# Patient Record
Sex: Female | Born: 1980 | Race: White | Hispanic: No | Marital: Married | Smoking: Never smoker
Health system: Southern US, Community
[De-identification: ages and names within clinical notes are randomized; demographics above are authoritative.]

## PROBLEM LIST (undated history)

## (undated) DIAGNOSIS — Z8619 Personal history of other infectious and parasitic diseases: Secondary | ICD-10-CM

## (undated) DIAGNOSIS — E282 Polycystic ovarian syndrome: Secondary | ICD-10-CM

## (undated) DIAGNOSIS — E781 Pure hyperglyceridemia: Secondary | ICD-10-CM

## (undated) DIAGNOSIS — M549 Dorsalgia, unspecified: Secondary | ICD-10-CM

## (undated) HISTORY — PX: NO PAST SURGERIES: SHX2092

## (undated) HISTORY — DX: Dorsalgia, unspecified: M54.9

## (undated) HISTORY — DX: Personal history of other infectious and parasitic diseases: Z86.19

## (undated) HISTORY — PX: ABDOMINAL HYSTERECTOMY: SHX81

## (undated) HISTORY — DX: Pure hyperglyceridemia: E78.1

---

## 2006-03-03 HISTORY — PX: OVARIAN CYST REMOVAL: SHX89

## 2010-08-28 ENCOUNTER — Encounter: Payer: Self-pay | Admitting: Obstetrics and Gynecology

## 2011-01-06 ENCOUNTER — Emergency Department (INDEPENDENT_AMBULATORY_CARE_PROVIDER_SITE_OTHER)
Admission: EM | Admit: 2011-01-06 | Discharge: 2011-01-06 | Disposition: A | Discharge status: Home / Self Care | Payer: BC Managed Care – PPO | Source: Home / Self Care | Attending: Family Medicine | Admitting: Family Medicine

## 2011-01-06 DIAGNOSIS — J020 Streptococcal pharyngitis: Secondary | ICD-10-CM

## 2011-01-06 HISTORY — DX: Polycystic ovarian syndrome: E28.2

## 2011-01-06 MED ORDER — ACETAMINOPHEN 325 MG PO TABS
ORAL_TABLET | ORAL | Status: AC
  Administered 2011-01-06: 650 mg via ORAL
  Filled 2011-01-06: qty 2

## 2011-01-06 MED ORDER — AMOXICILLIN 500 MG PO CAPS: 500.0000 mg | ORAL_CAPSULE | Freq: Three times a day (TID) | ORAL | Status: AC

## 2011-01-06 NOTE — ED Notes (Signed)
Strep test results: positive for Strep

## 2011-01-06 NOTE — ED Provider Notes (Signed)
History     CSN: [id number] Arrival date & time: 01/06/2011  8:48 AM   First MD Initiated Contact with Patient 01/06/11 [phone]      Chief Complaint  Patient presents with  . Sore Throat    (Consider location/radiation/quality/duration/timing/severity/associated sxs/prior treatment) Patient is a 30 y.o. female presenting with pharyngitis. The history is provided by the patient.  Sore Throat This is a new problem. The current episode started more than 2 days ago. The problem occurs constantly. The problem has not changed since onset.Pertinent negatives include no chest pain, no headaches and no shortness of breath. The symptoms are aggravated by swallowing. The symptoms are relieved by nothing. She has tried nothing for the symptoms.    Past Medical History  Diagnosis Date  . Polycystic ovarian disease     History reviewed. No pertinent past surgical history.  No family history on file.  History  Substance Use Topics  . Smoking status: Never Smoker   . Smokeless tobacco: Not on file  . Alcohol Use: No    OB History    Grav Para Term Preterm Abortions TAB SAB Ect Mult Living                  Review of Systems  Constitutional: Positive for fever.  HENT: Positive for ear pain, sore throat and trouble swallowing. Negative for sneezing.   Eyes: Negative.   Respiratory: Negative for shortness of breath.   Cardiovascular: Negative.  Negative for chest pain.  Gastrointestinal: Negative.   Genitourinary: Negative.   Musculoskeletal: Negative.   Neurological: Negative for headaches.    Allergies  Review of patient's allergies indicates no known allergies.  Home Medications   Current Outpatient Rx  Name Route Sig Dispense Refill  . TYLENOL PO Oral Take by mouth.      Marland Kitchen ADVIL PO Oral Take by mouth.        BP 115/76  Pulse 90  Temp(Src) 99.9 F (37.7 C) (Oral)  Resp 20  SpO2 100%  Physical Exam  Constitutional: She appears well-developed and well-nourished.    HENT:  Head: Normocephalic and atraumatic.  Right Ear: Tympanic membrane normal.  Left Ear: Tympanic membrane is injected.  Nose: Nose normal.  Mouth/Throat: Uvula is midline and mucous membranes are normal. Posterior oropharyngeal erythema present. No oropharyngeal exudate.  Eyes: EOM are normal. Pupils are equal, round, and reactive to light.  Neck: Normal range of motion. Neck supple.  Cardiovascular: Normal rate and regular rhythm.   Pulmonary/Chest: Effort normal.  Lymphadenopathy:    She has cervical adenopathy.       Right cervical: Superficial cervical adenopathy present.       Left cervical: Superficial cervical adenopathy present.  Skin: Skin is warm and dry.    ED Course  Procedures (including critical care time)   Labs Reviewed  POCT RAPID STREP A   No results found.   No diagnosis found.    MDM  Your rapid strep test was positive. Combined with 3/4 Centor criteria, will treat for strep pharyngitis with antibiotic therapy.        Richardo Priest, MD 01/06/11 1012

## 2011-01-06 NOTE — ED Notes (Signed)
c/o sore throat, bilateral Ear pain and fever since 01-01-11. Denies cold sx.

## 2011-09-22 ENCOUNTER — Ambulatory Visit (INDEPENDENT_AMBULATORY_CARE_PROVIDER_SITE_OTHER): Payer: 59 | Admitting: Internal Medicine

## 2011-09-22 ENCOUNTER — Encounter: Payer: Self-pay | Admitting: Internal Medicine

## 2011-09-22 VITALS — BP 100/72 | HR 68 | Temp 98.5°F | Ht 64.0 in | Wt 167.0 lb

## 2011-09-22 DIAGNOSIS — Z0000 Encounter for general adult medical examination without abnormal findings: Secondary | ICD-10-CM

## 2011-09-22 DIAGNOSIS — E781 Pure hyperglyceridemia: Secondary | ICD-10-CM | POA: Insufficient documentation

## 2011-09-22 NOTE — Patient Instructions (Addendum)
It was good to see you today. I suspect your triglyceride (cholesterol) abnormalities on recent labs is related to being "non-fasting" - will repeat rather than prescribe medications at this time. Rest of your cholesterol looks very good Health Maintenance reviewed - all recommended immunizations and age-appropriate screenings are up-to-date. Test(s) ordered today. Your results will be called to you after review (48-72hours after test completion). If any changes need to be made, you will be notified at that time. Work on lifestyle changes as discussed (low fat, low carb, increased protein diet; improved exercise efforts; weight loss) to control sugar, blood pressure and cholesterol levels and/or reduce risk of developing other medical problems. Look into [email or web address] or other type of food journal to assist you in this process. Please schedule followup in 1-2 years for medical physical, call sooner if problems.  Hypertriglyceridemia   Diet for High blood levels of Triglycerides Most fats in food are triglycerides. Triglycerides in your blood are stored as fat in your body. High levels of triglycerides in your blood may put you at a greater risk for heart disease and stroke.   Normal triglyceride levels are less than 150 mg/dL. Borderline high levels are 150-199 mg/dl. High levels are 200 - 499 mg/dL, and very high triglyceride levels are greater than 500 mg/dL. The decision to treat high triglycerides is generally based on the level. For people with borderline or high triglyceride levels, treatment includes weight loss and exercise. Drugs are recommended for people with very high triglyceride levels. Many people who need treatment for high triglyceride levels have metabolic syndrome. This syndrome is a collection of disorders that often include: insulin resistance, high blood pressure, blood clotting problems, high cholesterol and triglycerides. TESTING PROCEDURE FOR TRIGLYCERIDES  You should not  eat 4 hours before getting your triglycerides measured. The normal range of triglycerides is between 10 and 250 milligrams per deciliter (mg/dl). Some people may have extreme levels (1000 or above), but your triglyceride level may be too high if it is above 150 mg/dl, depending on what other risk factors you have for heart disease.   People with high blood triglycerides may also have high blood cholesterol levels. If you have high blood cholesterol as well as high blood triglycerides, your risk for heart disease is probably greater than if you only had high triglycerides. High blood cholesterol is one of the main risk factors for heart disease.  CHANGING YOUR DIET   Your weight can affect your blood triglyceride level. If you are more than 20% above your ideal body weight, you may be able to lower your blood triglycerides by losing weight. Eating less and exercising regularly is the best way to combat this. Fat provides more calories than any other food. The best way to lose weight is to eat less fat. Only 30% of your total calories should come from fat. Less than 7% of your diet should come from saturated fat. A diet low in fat and saturated fat is the same as a diet to decrease blood cholesterol. By eating a diet lower in fat, you may lose weight, lower your blood cholesterol, and lower your blood triglyceride level.   Eating a diet low in fat, especially saturated fat, may also help you lower your blood triglyceride level. Ask your dietitian to help you figure how much fat you can eat based on the number of calories your caregiver has prescribed for you.   Exercise, in addition to helping with weight loss may also help lower triglyceride levels.  Alcohol can increase blood triglycerides. You may need to stop drinking alcoholic beverages.   Too much carbohydrate in your diet may also increase your blood triglycerides. Some complex carbohydrates are necessary in your diet. These may include bread, rice,  potatoes, other starchy vegetables and cereals.   Reduce "simple" carbohydrates. These may include pure sugars, candy, honey, and jelly without losing other nutrients. If you have the kind of high blood triglycerides that is affected by the amount of carbohydrates in your diet, you will need to eat less sugar and less high-sugar foods. Your caregiver can help you with this.   Adding 2-4 grams of fish oil (EPA+ DHA) may also help lower triglycerides. Speak with your caregiver before adding any supplements to your regimen.  Following the Diet  Maintain your ideal weight. Your caregivers can help you with a diet. Generally, eating less food and getting more exercise will help you lose weight. Joining a weight control group may also help. Ask your caregivers for a good weight control group in your area.   Eat low-fat foods instead of high-fat foods. This can help you lose weight too.   These foods are lower in fat. Eat MORE of these:   Dried beans, peas, and lentils.   Egg whites.   Low-fat cottage cheese.   Fish.   Lean cuts of meat, such as round, sirloin, rump, and flank (cut extra fat off meat you fix).   Whole grain breads, cereals and pasta.   Skim and nonfat dry milk.   Low-fat yogurt.   Poultry without the skin.   Cheese made with skim or part-skim milk, such as mozzarella, parmesan, farmers', ricotta, or pot cheese.  These are higher fat foods. Eat LESS of these:    Whole milk and foods made from whole milk, such as American, blue, cheddar, monterey jack, and swiss cheese   High-fat meats, such as luncheon meats, sausages, knockwurst, bratwurst, hot dogs, ribs, corned beef, ground pork, and regular ground beef.   Fried foods.  Limit saturated fats in your diet. Substituting unsaturated fat for saturated fat may decrease your blood triglyceride level. You will need to read package labels to know which products contain saturated fats.   These foods are high in saturated fat.  Eat LESS of these:   Fried pork skins.   Whole milk.   Skin and fat from poultry.   Palm oil.   Butter.   Shortening.   Cream cheese.   Tomasa Blase.   Margarines and baked goods made from listed oils.   Vegetable shortenings.   Chitterlings.   Fat from meats.   Coconut oil.   Palm kernel oil.   Lard.   Cream.   Sour cream.   Fatback.   Coffee whiteners and non-dairy creamers made with these oils.   Cheese made from whole milk.  Use unsaturated fats (both polyunsaturated and monounsaturated) moderately. Remember, even though unsaturated fats are better than saturated fats; you still want a diet low in total fat.   These foods are high in unsaturated fat:   Canola oil.   Sunflower oil.   Mayonnaise.   Almonds.   Peanuts.   Pine nuts.   Margarines made with these oils.   Safflower oil.   Olive oil.   Avocados.   Cashews.   Peanut butter.   Sunflower seeds.   Soybean oil.   Peanut oil.   Olives.   Pecans.   Walnuts.   Pumpkin seeds.  Avoid sugar and  other high-sugar foods. This will decrease carbohydrates without decreasing other nutrients. Sugar in your food goes rapidly to your blood. When there is excess sugar in your blood, your liver may use it to make more triglycerides. Sugar also contains calories without other important nutrients.   Eat LESS of these:   Sugar, brown sugar, powdered sugar, jam, jelly, preserves, honey, syrup, molasses, pies, candy, cakes, cookies, frosting, pastries, colas, soft drinks, punches, fruit drinks, and regular gelatin.   Avoid alcohol. Alcohol, even more than sugar, may increase blood triglycerides. In addition, alcohol is high in calories and low in nutrients. Ask for sparkling water, or a diet soft drink instead of an alcoholic beverage.  Suggestions for planning and preparing meals   Bake, broil, grill or roast meats instead of frying.   Remove fat from meats and skin from poultry before cooking.     Add spices, herbs, lemon juice or vinegar to vegetables instead of salt, rich sauces or gravies.   Use a non-stick skillet without fat or use no-stick sprays.   Cool and refrigerate stews and broth. Then remove the hardened fat floating on the surface before serving.   Refrigerate meat drippings and skim off fat to make low-fat gravies.   Serve more fish.   Use less butter, margarine and other high-fat spreads on bread or vegetables.   Use skim or reconstituted non-fat dry milk for cooking.   Cook with low-fat cheeses.   Substitute low-fat yogurt or cottage cheese for all or part of the sour cream in recipes for sauces, dips or congealed salads.   Use half yogurt/half mayonnaise in salad recipes.   Substitute evaporated skim milk for cream. Evaporated skim milk or reconstituted non-fat dry milk can be whipped and substituted for whipped cream in certain recipes.   Choose fresh fruits for dessert instead of high-fat foods such as pies or cakes. Fruits are naturally low in fat.  When Dining Out   Order low-fat appetizers such as fruit or vegetable juice, pasta with vegetables or tomato sauce.   Select clear, rather than cream soups.   Ask that dressings and gravies be served on the side. Then use less of them.   Order foods that are baked, broiled, poached, steamed, stir-fried, or roasted.   Ask for margarine instead of butter, and use only a small amount.   Drink sparkling water, unsweetened tea or coffee, or diet soft drinks instead of alcohol or other sweet beverages.  QUESTIONS AND ANSWERS ABOUT OTHER FATS IN THE BLOOD: SATURATED FAT, TRANS FAT, AND CHOLESTEROL What is trans fat? Trans fat is a type of fat that is formed when vegetable oil is hardened through a process called hydrogenation. This process helps makes foods more solid, gives them shape, and prolongs their shelf life. Trans fats are also called hydrogenated or partially hydrogenated oils.   What do saturated  fat, trans fat, and cholesterol in foods have to do with heart disease? Saturated fat, trans fat, and cholesterol in the diet all raise the level of LDL "bad" cholesterol in the blood. The higher the LDL cholesterol, the greater the risk for coronary heart disease (CHD). Saturated fat and trans fat raise LDL similarly.   What foods contain saturated fat, trans fat, and cholesterol? High amounts of saturated fat are found in animal products, such as fatty cuts of meat, chicken skin, and full-fat dairy products like butter, whole milk, cream, and cheese, and in tropical vegetable oils such as palm, palm kernel, and coconut  oil. Trans fat is found in some of the same foods as saturated fat, such as vegetable shortening, some margarines (especially hard or stick margarine), crackers, cookies, baked goods, fried foods, salad dressings, and other processed foods made with partially hydrogenated vegetable oils. Small amounts of trans fat also occur naturally in some animal products, such as milk products, beef, and lamb. Foods high in cholesterol include liver, other organ meats, egg yolks, shrimp, and full-fat dairy products. How can I use the new food label to make heart-healthy food choices? Check the Nutrition Facts panel of the food label. Choose foods lower in saturated fat, trans fat, and cholesterol. For saturated fat and cholesterol, you can also use the Percent Daily Value (%DV): 5% DV or less is low, and 20% DV or more is high. (There is no %DV for trans fat.) Use the Nutrition Facts panel to choose foods low in saturated fat and cholesterol, and if the trans fat is not listed, read the ingredients and limit products that list shortening or hydrogenated or partially hydrogenated vegetable oil, which tend to be high in trans fat. POINTS TO REMEMBER: YOU NEED A LITTLE TLC (THERAPEUTIC LIFESTYLE CHANGES)  Discuss your risk for heart disease with your caregivers, and take steps to reduce risk factors.    Change your diet. Choose foods that are low in saturated fat, trans fat, and cholesterol.   Add exercise to your daily routine if it is not already being done. Participate in physical activity of moderate intensity, like brisk walking, for at least 30 minutes on most, and preferably all days of the week. No time? Break the 30 minutes into three, 10-minute segments during the day.   Stop smoking. If you do smoke, contact your caregiver to discuss ways in which they can help you quit.   Do not use street drugs.   Maintain a normal weight.   Maintain a healthy blood pressure.   Keep up with your blood work for checking the fats in your blood as directed by your caregiver.  Document Released: 12/06/2003 Document Revised: 02/06/2011 Document Reviewed: 07/03/2008 [facility] Patient Information 2012 [place], [place].

## 2011-09-22 NOTE — Assessment & Plan Note (Signed)
Mild on 09/03/11 labs (256) - non fasting Will recheck before tx - but note strong FH DM  Advised diet/exercise at this time

## 2011-09-22 NOTE — Progress Notes (Signed)
  Subjective:    Patient ID: Donna Bradford, female    DOB: January 13, 1981, 31 y.o.   MRN: [id number]  HPI  patient is here today for annual physical. Patient feels well overall Recent visit 09/2011 with ob-gyn - discovered high TGs on labs, not fasting  Past Medical History  Diagnosis Date  . Polycystic ovarian disease   . History of chicken pox   . Hypertriglyceridemia    Family History  Problem Relation Age of Onset  . Hypertension Paternal Grandfather   . Kidney disease Paternal Grandfather   . Hypertension Mother   . Hypertension Father   . Diabetes Mother   . Diabetes Maternal Grandmother   . Hypertension Maternal Grandmother   . Diabetes Maternal Grandfather   . Thyroid disease Paternal Grandmother   . Polycystic ovary syndrome Sister   . Muscular dystrophy Son    History  Substance Use Topics  . Smoking status: Never Smoker   . Smokeless tobacco: Not on file  . Alcohol Use: No    Review of Systems Constitutional: Negative for fever or weight change.  Respiratory: Negative for cough and shortness of breath.   Cardiovascular: Negative for chest pain or palpitations.  Gastrointestinal: Negative for abdominal pain, no bowel changes.  Musculoskeletal: Negative for gait problem or joint swelling.  Skin: Negative for rash.  Neurological: Negative for dizziness or headache.  No other specific complaints in a complete review of systems (except as listed in HPI above).     Objective:   Physical Exam BP 100/72  Pulse 68  Temp 98.5 F (36.9 C) (Oral)  Ht 5\' 4"  (1.626 m)  Wt 167 lb (75.751 kg)  BMI 28.67 kg/m2  SpO2 95% Wt Readings from Last 3 Encounters:  09/22/11 167 lb (75.751 kg)   Constitutional: She is overweight, but appears well-developed and well-nourished. No distress.  HENT: Head: Normocephalic and atraumatic. Ears: B TMs ok, no erythema or effusion; Nose: Nose normal. Mouth/Throat: Oropharynx is clear and moist. No oropharyngeal exudate.  Eyes:  Conjunctivae and EOM are normal. Pupils are equal, round, and reactive to light. No scleral icterus.  Neck: Normal range of motion. Neck supple. No JVD present. No thyromegaly present.  Cardiovascular: Normal rate, regular rhythm and normal heart sounds.  No murmur heard. No BLE edema. Pulmonary/Chest: Effort normal and breath sounds normal. No respiratory distress. She has no wheezes.  Abdominal: Soft. Bowel sounds are normal. She exhibits no distension. There is no tenderness. no masses Musculoskeletal: Normal range of motion, no joint effusions. No gross deformities Neurological: She is alert and oriented to person, place, and time. No cranial nerve deficit. Coordination normal.  Skin: Skin is warm and dry. No rash noted. No erythema.  Psychiatric: She has a normal mood and affect. Her behavior is normal. Judgment and thought content normal.   No results found for this basename: WBC,  HGB,  HCT,  PLT,  GLUCOSE,  CHOL,  TRIG,  HDL,  LDLDIRECT,  LDLCALC,  ALT,  AST,  NA,  K,  CL,  CREATININE,  BUN,  CO2,  TSH,  PSA,  INR,  GLUF,  HGBA1C,  MICROALBUR       Assessment & Plan:  CPX/v70.0 - Patient has been counseled on age-appropriate routine health concerns for screening and prevention. These are reviewed and up-to-date. Immunizations are up-to-date or declined. Labs ordered and will be reviewed.

## 2011-09-24 ENCOUNTER — Other Ambulatory Visit (INDEPENDENT_AMBULATORY_CARE_PROVIDER_SITE_OTHER): Payer: 59

## 2011-09-24 DIAGNOSIS — Z0000 Encounter for general adult medical examination without abnormal findings: Secondary | ICD-10-CM

## 2011-09-24 LAB — BASIC METABOLIC PANEL
BUN: 13 mg/dL (ref 6–23)
Calcium: 9.1 mg/dL (ref 8.4–10.5)
GFR: 124.67 mL/min (ref >60.00)
Potassium: 4.4 mEq/L (ref 3.5–5.1)
Sodium: 138 mEq/L (ref 135–145)

## 2011-09-24 LAB — URINALYSIS, ROUTINE W REFLEX MICROSCOPIC
Bilirubin Urine: NEGATIVE
Nitrite: NEGATIVE
Specific Gravity, Urine: 1.01 (ref 1.000–1.030)
Total Protein, Urine: NEGATIVE
pH: 8.5 (ref 5.0–8.0)

## 2011-09-24 LAB — HEPATIC FUNCTION PANEL
ALT: 28 U/L (ref 0–35)
AST: 18 U/L (ref 0–37)
Total Protein: 7.2 g/dL (ref 6.0–8.3)

## 2011-09-24 LAB — CBC WITH DIFFERENTIAL/PLATELET
Basophils Absolute: 0 10*3/uL (ref 0.0–0.1)
Eosinophils Relative: 1.9 % (ref 0.0–5.0)
HCT: 40 % (ref 36.0–46.0)
Lymphocytes Relative: 40.2 % (ref 12.0–46.0)
Lymphs Abs: 2.6 10*3/uL (ref 0.7–4.0)
Monocytes Relative: 8.6 % (ref 3.0–12.0)
Platelets: 229 10*3/uL (ref 150.0–400.0)
WBC: 6.4 10*3/uL (ref 4.5–10.5)

## 2011-09-24 LAB — LIPID PANEL
Cholesterol: 137 mg/dL (ref 0–200)
HDL: 42.1 mg/dL (ref >39.00)
VLDL: 28.2 mg/dL (ref 0.0–40.0)

## 2011-09-24 LAB — TSH: TSH: 0.55 u[IU]/mL (ref 0.35–5.50)

## 2011-10-06 ENCOUNTER — Other Ambulatory Visit: Payer: Self-pay | Admitting: Obstetrics & Gynecology

## 2011-10-14 ENCOUNTER — Encounter (HOSPITAL_COMMUNITY): Payer: Self-pay | Admitting: Pharmacist

## 2011-10-20 ENCOUNTER — Other Ambulatory Visit (HOSPITAL_COMMUNITY): Payer: 59

## 2011-10-24 ENCOUNTER — Ambulatory Visit (HOSPITAL_COMMUNITY): Admission: RE | Admit: 2011-10-24 | Payer: 59 | Source: Ambulatory Visit | Admitting: Obstetrics & Gynecology

## 2011-10-24 ENCOUNTER — Encounter (HOSPITAL_COMMUNITY): Admission: RE | Payer: Self-pay | Source: Ambulatory Visit

## 2011-10-24 SURGERY — DILATATION AND CURETTAGE /HYSTEROSCOPY
Anesthesia: Choice

## 2012-06-28 HISTORY — PX: DILATION AND CURETTAGE OF UTERUS: SHX78

## 2012-07-02 ENCOUNTER — Encounter: Payer: Self-pay | Admitting: Gynecologic Oncology

## 2012-07-15 ENCOUNTER — Ambulatory Visit: Payer: 59 | Admitting: Gynecologic Oncology

## 2014-02-02 ENCOUNTER — Encounter: Payer: Self-pay | Admitting: Family

## 2014-02-02 ENCOUNTER — Ambulatory Visit (INDEPENDENT_AMBULATORY_CARE_PROVIDER_SITE_OTHER): Payer: 59 | Admitting: Family

## 2014-02-02 ENCOUNTER — Other Ambulatory Visit (INDEPENDENT_AMBULATORY_CARE_PROVIDER_SITE_OTHER): Payer: 59

## 2014-02-02 VITALS — BP 124/88 | HR 62 | Temp 98.1°F | Resp 18 | Ht 64.0 in | Wt 190.8 lb

## 2014-02-02 DIAGNOSIS — Z0000 Encounter for general adult medical examination without abnormal findings: Secondary | ICD-10-CM

## 2014-02-02 DIAGNOSIS — E781 Pure hyperglyceridemia: Secondary | ICD-10-CM

## 2014-02-02 LAB — LIPID PANEL
Cholesterol: 156 mg/dL (ref 0–200)
HDL: 27.4 mg/dL — AB (ref >39.00)
NONHDL: 128.6
Total CHOL/HDL Ratio: 6
Triglycerides: 224 mg/dL — ABNORMAL HIGH (ref 0.0–149.0)
VLDL: 44.8 mg/dL — ABNORMAL HIGH (ref 0.0–40.0)

## 2014-02-02 LAB — CBC
HEMATOCRIT: 41.3 % (ref 36.0–46.0)
HEMOGLOBIN: 13.9 g/dL (ref 12.0–15.0)
MCHC: 33.7 g/dL (ref 30.0–36.0)
MCV: 90.8 fl (ref 78.0–100.0)
PLATELETS: 282 10*3/uL (ref 150.0–400.0)
RBC: 4.56 Mil/uL (ref 3.87–5.11)
RDW: 12.6 % (ref 11.5–15.5)
WBC: 6.5 10*3/uL (ref 4.0–10.5)

## 2014-02-02 LAB — BASIC METABOLIC PANEL
BUN: 12 mg/dL (ref 6–23)
CALCIUM: 8.8 mg/dL (ref 8.4–10.5)
CO2: 24 mEq/L (ref 19–32)
Chloride: 106 mEq/L (ref 96–112)
Creatinine, Ser: 0.5 mg/dL (ref 0.4–1.2)
GFR: 145.46 mL/min (ref >60.00)
GLUCOSE: 89 mg/dL (ref 70–99)
Potassium: 3.8 mEq/L (ref 3.5–5.1)
Sodium: 140 mEq/L (ref 135–145)

## 2014-02-02 LAB — TSH: TSH: 0.67 u[IU]/mL (ref 0.35–4.50)

## 2014-02-02 NOTE — Patient Instructions (Signed)
Thank you for choosing [facility].  Summary/Instructions:  Please stop by the lab on the basement level of the building for your blood work. Your results will be released to MyChart (or called to you) after review, usually within 72hours after test completion. If any changes need to be made, you will be notified at that same time.  Health Maintenance Adopting a healthy lifestyle and getting preventive care can go a long way to promote health and wellness. Talk with your health care provider about what schedule of regular examinations is right for you. This is a good chance for you to check in with your provider about disease prevention and staying healthy. In between checkups, there are plenty of things you can do on your own. Experts have done a lot of research about which lifestyle changes and preventive measures are most likely to keep you healthy. Ask your health care provider for more information. WEIGHT AND DIET  Eat a healthy diet  Be sure to include plenty of vegetables, fruits, low-fat dairy products, and lean protein.  Do not eat a lot of foods high in solid fats, added sugars, or salt.  Get regular exercise. This is one of the most important things you can do for your health.  Most adults should exercise for at least 150 minutes each week. The exercise should increase your heart rate and make you sweat (moderate-intensity exercise).  Most adults should also do strengthening exercises at least twice a week. This is in addition to the moderate-intensity exercise.  Maintain a healthy weight  Body mass index (BMI) is a measurement that can be used to identify possible weight problems. It estimates body fat based on height and weight. Your health care provider can help determine your BMI and help you achieve or maintain a healthy weight.  For females 20 years of age and older:   A BMI below 18.5 is considered underweight.  A BMI of 18.5 to 24.9 is normal.  A BMI of 25  to 29.9 is considered overweight.  A BMI of 30 and above is considered obese.  Watch levels of cholesterol and blood lipids  You should start having your blood tested for lipids and cholesterol at 35 years of age, then have this test every 5 years.  You may need to have your cholesterol levels checked more often if:  Your lipid or cholesterol levels are high.  You are older than 35 years of age.  You are at high risk for heart disease.  CANCER SCREENING   Lung Cancer  Lung cancer screening is recommended for adults 55-80 years old who are at high risk for lung cancer because of a history of smoking.  A yearly low-dose CT scan of the lungs is recommended for people who:  Currently smoke.  Have quit within the past 15 years.  Have at least a 30-pack-year history of smoking. A pack year is smoking an average of one pack of cigarettes a day for 1 year.  Yearly screening should continue until it has been 15 years since you quit.  Yearly screening should stop if you develop a health problem that would prevent you from having lung cancer treatment.  Breast Cancer  Practice breast self-awareness. This means understanding how your breasts normally appear and feel.  It also means doing regular breast self-exams. Let your health care provider know about any changes, no matter how small.  If you are in your 20s or 30s, you should have a clinical breast exam (  exam (CBE) by a health care provider every 1-3 years as part of a regular health exam.  If you are 94 or older, have a CBE every year. Also consider having a breast X-ray (mammogram) every year.  If you have a family history of breast cancer, talk to your health care provider about genetic screening.  If you are at high risk for breast cancer, talk to your health care provider about having an MRI and a mammogram every year.  Breast cancer gene (BRCA) assessment is recommended for women who have family members with BRCA-related  cancers. BRCA-related cancers include:  Breast.  Ovarian.  Tubal.  Peritoneal cancers.  Results of the assessment will determine the need for genetic counseling and BRCA1 and BRCA2 testing. Cervical Cancer Routine pelvic examinations to screen for cervical cancer are no longer recommended for nonpregnant women who are considered low risk for cancer of the pelvic organs (ovaries, uterus, and vagina) and who do not have symptoms. A pelvic examination may be necessary if you have symptoms including those associated with pelvic infections. Ask your health care provider if a screening pelvic exam is right for you.   The Pap test is the screening test for cervical cancer for women who are considered at risk.  If you had a hysterectomy for a problem that was not cancer or a condition that could lead to cancer, then you no longer need Pap tests.  If you are older than 65 years, and you have had normal Pap tests for the past 10 years, you no longer need to have Pap tests.  If you have had past treatment for cervical cancer or a condition that could lead to cancer, you need Pap tests and screening for cancer for at least 20 years after your treatment.  If you no longer get a Pap test, assess your risk factors if they change (such as having a new sexual partner). This can affect whether you should start being screened again.  Some women have medical problems that increase their chance of getting cervical cancer. If this is the case for you, your health care provider may recommend more frequent screening and Pap tests.  The human papillomavirus (HPV) test is another test that may be used for cervical cancer screening. The HPV test looks for the virus that can cause cell changes in the cervix. The cells collected during the Pap test can be tested for HPV.  The HPV test can be used to screen women 92 years of age and older. Getting tested for HPV can extend the interval between normal Pap tests from  three to five years.  An HPV test also should be used to screen women of any age who have unclear Pap test results.  After 35 years of age, women should have HPV testing as often as Pap tests.  Colorectal Cancer  This type of cancer can be detected and often prevented.  Routine colorectal cancer screening usually begins at 35 years of age and continues through 35 years of age.  Your health care provider may recommend screening at an earlier age if you have risk factors for colon cancer.  Your health care provider may also recommend using home test kits to check for hidden blood in the stool.  A small camera at the end of a tube can be used to examine your colon directly (sigmoidoscopy or colonoscopy). This is done to check for the earliest forms of colorectal cancer.  Routine screening usually begins at age 58.  Direct examination of the colon should be repeated every 5-10 years through 35 years of age. However, you may need to be screened more often if early forms of precancerous polyps or small growths are found. Skin Cancer  Check your skin from head to toe regularly.  Tell your health care provider about any new moles or changes in moles, especially if there is a change in a mole's shape or color.  Also tell your health care provider if you have a mole that is larger than the size of a pencil eraser.  Always use sunscreen. Apply sunscreen liberally and repeatedly throughout the day.  Protect yourself by wearing long sleeves, pants, a wide-brimmed hat, and sunglasses whenever you are outside. HEART DISEASE, DIABETES, AND HIGH BLOOD PRESSURE   Have your blood pressure checked at least every 1-2 years. High blood pressure causes heart disease and increases the risk of stroke.  If you are between 55 years and 79 years old, ask your health care provider if you should take aspirin to prevent strokes.  Have regular diabetes screenings. This involves taking a blood sample to check  your fasting blood sugar level.  If you are at a normal weight and have a low risk for diabetes, have this test once every three years after 35 years of age.  If you are overweight and have a high risk for diabetes, consider being tested at a younger age or more often. PREVENTING INFECTION  Hepatitis B  If you have a higher risk for hepatitis B, you should be screened for this virus. You are considered at high risk for hepatitis B if:  You were born in a country where hepatitis B is common. Ask your health care provider which countries are considered high risk.  Your parents were born in a high-risk country, and you have not been immunized against hepatitis B (hepatitis B vaccine).  You have HIV or AIDS.  You use needles to inject street drugs.  You live with someone who has hepatitis B.  You have had sex with someone who has hepatitis B.  You get hemodialysis treatment.  You take certain medicines for conditions, including cancer, organ transplantation, and autoimmune conditions. Hepatitis C  Blood testing is recommended for:  Everyone born from 1945 through 1965.  Anyone with known risk factors for hepatitis C. Sexually transmitted infections (STIs)  You should be screened for sexually transmitted infections (STIs) including gonorrhea and chlamydia if:  You are sexually active and are younger than 35 years of age.  You are older than 35 years of age and your health care provider tells you that you are at risk for this type of infection.  Your sexual activity has changed since you were last screened and you are at an increased risk for chlamydia or gonorrhea. Ask your health care provider if you are at risk.  If you do not have HIV, but are at risk, it may be recommended that you take a prescription medicine daily to prevent HIV infection. This is called pre-exposure prophylaxis (PrEP). You are considered at risk if:  You are sexually active and do not regularly use  condoms or know the HIV status of your partner(s).  You take drugs by injection.  You are sexually active with a partner who has HIV. Talk with your health care provider about whether you are at high risk of being infected with HIV. If you choose to begin PrEP, you should first be tested for HIV. You should then be   tested every 3 months for as long as you are taking PrEP.  PREGNANCY   If you are premenopausal and you may become pregnant, ask your health care provider about preconception counseling.  If you may become pregnant, take 400 to 800 micrograms (mcg) of folic acid every day.  If you want to prevent pregnancy, talk to your health care provider about birth control (contraception). OSTEOPOROSIS AND MENOPAUSE   Osteoporosis is a disease in which the bones lose minerals and strength with aging. This can result in serious bone fractures. Your risk for osteoporosis can be identified using a bone density scan.  If you are 65 years of age or older, or if you are at risk for osteoporosis and fractures, ask your health care provider if you should be screened.  Ask your health care provider whether you should take a calcium or vitamin D supplement to lower your risk for osteoporosis.  Menopause may have certain physical symptoms and risks.  Hormone replacement therapy may reduce some of these symptoms and risks. Talk to your health care provider about whether hormone replacement therapy is right for you.  HOME CARE INSTRUCTIONS   Schedule regular health, dental, and eye exams.  Stay current with your immunizations.   Do not use any tobacco products including cigarettes, chewing tobacco, or electronic cigarettes.  If you are pregnant, do not drink alcohol.  If you are breastfeeding, limit how much and how often you drink alcohol.  Limit alcohol intake to no more than 1 drink per day for nonpregnant women. One drink equals 12 ounces of beer, 5 ounces of wine, or 1 ounces of hard  liquor.  Do not use street drugs.  Do not share needles.  Ask your health care provider for help if you need support or information about quitting drugs.  Tell your health care provider if you often feel depressed.  Tell your health care provider if you have ever been abused or do not feel safe at home. Document Released: 09/02/2010 Document Revised: 07/04/2013 Document Reviewed: 01/19/2013 [employer] Patient Information 2015 [employer]. This information is not intended to replace advice given to you by your health care provider. Make sure you discuss any questions you have with your health care provider.  

## 2014-02-02 NOTE — Progress Notes (Signed)
Pre visit review using our clinic review tool, if applicable. No additional management support is needed unless otherwise documented below in the visit note. 

## 2014-02-02 NOTE — Assessment & Plan Note (Signed)
1) Anticipatory Guidance: Discussed importance of wearing a seatbelt while driving and not texting while driving; changing batteries in smoke detector at least once annually; wearing suntan lotion when outside; eating a balanced and moderate diet; getting physical activity at least 30 minutes per day.  2) Immunizations / Screenings / Labs:  Tetanus shot up-to-date. Patient declined flu shot. due for eye exam. All other screenings are up-to-date per recommendations. Obtain CBC, BMET, Lipid profile and TSH.   Overall well exam. Current risk factors include obesity. Discussed dietary and physical activity measures to improve this. 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 Follow up prevention exam in one year pending lab results.

## 2014-02-02 NOTE — Progress Notes (Signed)
Subjective:    Patient ID: Donna Bradford, female    DOB: March 01, 1981, 33 y.o.   MRN: [id number]  Chief Complaint  Patient presents with  . CPE    Fasting    HPI:  Donna Bradford is a 33 y.o. female who presents today for an annual wellness visit.   1) Health Maintenance - Describes overall health as "so-so."  Diet - Eating a regular balanced diet including fruits and vegetables.  Exercise - Walking and some cardio about 4x per week  2) Preventative Exams / Immunizations:  Dental -- Up to date Vision -- Due for exam   Health Maintenance  Topic Date Due  . INFLUENZA VACCINE  10/01/2013  . PAP SMEAR  09/03/2014  . TETANUS/TDAP  08/31/2016  Declines flu shot  Currently doing colonoscopy secondary to high risk.   There is no immunization history on file for this patient.  No Known Allergies  Current Outpatient Prescriptions on File Prior to Visit  Medication Sig Dispense Refill  . Multiple Vitamins-Minerals (MULTIVITAMIN PO) Take by mouth.     No current facility-administered medications on file prior to visit.    Past Medical History  Diagnosis Date  . Polycystic ovarian disease   . History of chicken pox   . Hypertriglyceridemia   . Vaginal delivery     x1  . Back pain    Past Surgical History  Procedure Laterality Date  . No past surgeries    . Cesarean section      x1  . Ovarian cyst removal  2008  . Dilation and curettage of uterus  06/28/12  . Abdominal hysterectomy     Family History  Problem Relation Age of Onset  . Hypertension Paternal Grandfather   . Kidney disease Paternal Grandfather   . Hypertension Mother   . Hypertension Father   . Diabetes Mother   . Diabetes Maternal Grandmother   . Hypertension Maternal Grandmother   . Diabetes Maternal Grandfather   . Thyroid disease Paternal Grandmother   . Polycystic ovary syndrome Sister   . Muscular dystrophy Son    History   Social History  . Marital Status: Married   Spouse Name: N/A    Number of Children: 2  . Years of Education: 14   Occupational History  . 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 Smoking status: Never Smoker   . Smokeless tobacco: Never Used  . Alcohol Use: No  . Drug Use: No  . Sexual Activity: Not on file   Other Topics Concern  . Not on file   Social History Narrative    Review of Systems  Constitutional: Denies fever, chills, fatigue, or significant weight gain/loss. HENT: Head: Denies headache or neck pain Ears: Denies changes in hearing, ringing in ears, earache, drainage Nose: Denies discharge, stuffiness, itching, nosebleed, sinus pain Throat: Denies sore throat, hoarseness, dry mouth, sores, thrush Eyes: Denies loss/changes in vision, pain, redness, blurry/double vision, flashing lights Cardiovascular: Denies chest pain/discomfort, tightness, palpitations, shortness of breath with activity, difficulty lying down, swelling, sudden awakening with shortness of breath Respiratory: Denies shortness of breath, cough, sputum production, wheezing Gastrointestinal: Denies dysphasia, heartburn, change in appetite, nausea, change in bowel habits, rectal bleeding, constipation, diarrhea, yellow skin or eyes Genitourinary: Denies frequency, urgency, burning/pain, blood in urine, incontinence, change in urinary strength. Musculoskeletal: Denies muscle/joint pain (other than below), stiffness, back pain, redness or swelling of joints, trauma Back pain - has a child in a wheelchair  that weighs 200 lb and has to lift him multiple times per day. Has been seen by orthopedics and was diagnosed with bulging disks. Skin: Denies rashes, lumps, itching, dryness, color changes, or hair/nail changes Neurological: Denies dizziness, fainting, seizures, weakness, numbness, tingling, tremor Psychiatric - Denies nervousness, stress, depression or memory loss Endocrine: Denies heat or cold intolerance, sweating, frequent urination,  excessive thirst, changes in appetite Hematologic: Denies ease of bruising or bleeding    Objective:    BP 124/88 mmHg  Pulse 62  Temp(Src) 98.1 F (36.7 C) (Oral)  Resp 18  Ht 5\' 4"  (1.626 m)  Wt 190 lb 12.8 oz (86.546 kg)  BMI 32.73 kg/m2  SpO2 99% Nursing note and vital signs reviewed.  Physical Exam  Constitutional: She is oriented to person, place, and time. She appears well-developed and well-nourished.  HENT:  Head: Normocephalic.  Right Ear: Hearing, tympanic membrane, external ear and ear canal normal.  Left Ear: Hearing, tympanic membrane, external ear and ear canal normal.  Nose: Nose normal.  Mouth/Throat: Uvula is midline, oropharynx is clear and moist and mucous membranes are normal.  Eyes: Conjunctivae and EOM are normal. Pupils are equal, round, and reactive to light.  Neck: Neck supple. No JVD present. No tracheal deviation present. No thyromegaly present.  Cardiovascular: Normal rate, regular rhythm, normal heart sounds and intact distal pulses.   Pulmonary/Chest: Effort normal and breath sounds normal.  Abdominal: Soft. Bowel sounds are normal. She exhibits no distension and no mass. There is no tenderness. There is no rebound and no guarding.  Musculoskeletal: Normal range of motion. She exhibits no edema or tenderness.  Mild lumbar tenderness.  Lymphadenopathy:    She has no cervical adenopathy.  Neurological: She is alert and oriented to person, place, and time. She has normal reflexes. No cranial nerve deficit. She exhibits normal muscle tone. Coordination normal.  Skin: Skin is warm and dry.  Psychiatric: She has a normal mood and affect. Her behavior is normal. Judgment and thought content normal.       Assessment & Plan:

## 2014-02-04 LAB — LDL CHOLESTEROL, DIRECT: LDL DIRECT: 92.2 mg/dL

## 2014-02-05 ENCOUNTER — Telehealth: Payer: Self-pay | Admitting: Family

## 2014-02-05 NOTE — Telephone Encounter (Signed)
Please call and inform the patient that the labs drawn during her physical are within the expected ranges and no changes are needed at this time.

## 2014-02-06 NOTE — Telephone Encounter (Signed)
Called pt to let her know that labs were normal. Pt understood.

## 2015-12-04 ENCOUNTER — Other Ambulatory Visit: Payer: Self-pay | Admitting: Nurse Practitioner

## 2015-12-04 DIAGNOSIS — N644 Mastodynia: Secondary | ICD-10-CM

## 2015-12-07 ENCOUNTER — Ambulatory Visit
Admission: RE | Admit: 2015-12-07 | Discharge: 2015-12-07 | Disposition: A | Discharge status: Home / Self Care | Payer: 59 | Source: Ambulatory Visit | Attending: Nurse Practitioner | Admitting: Nurse Practitioner

## 2015-12-07 DIAGNOSIS — N644 Mastodynia: Secondary | ICD-10-CM

## 2016-05-29 ENCOUNTER — Other Ambulatory Visit: Payer: Self-pay | Admitting: Internal Medicine

## 2016-05-29 DIAGNOSIS — R945 Abnormal results of liver function studies: Principal | ICD-10-CM

## 2016-05-29 DIAGNOSIS — R7989 Other specified abnormal findings of blood chemistry: Secondary | ICD-10-CM

## 2016-06-10 ENCOUNTER — Ambulatory Visit
Admission: RE | Admit: 2016-06-10 | Discharge: 2016-06-10 | Disposition: A | Discharge status: Home / Self Care | Payer: 59 | Source: Ambulatory Visit | Attending: Internal Medicine | Admitting: Internal Medicine

## 2016-06-10 DIAGNOSIS — R945 Abnormal results of liver function studies: Principal | ICD-10-CM

## 2016-06-10 DIAGNOSIS — R7989 Other specified abnormal findings of blood chemistry: Secondary | ICD-10-CM

## 2017-02-17 ENCOUNTER — Encounter: Payer: Self-pay | Admitting: *Deleted

## 2017-02-17 ENCOUNTER — Emergency Department
Admission: EM | Admit: 2017-02-17 | Discharge: 2017-02-17 | Disposition: A | Discharge status: Home / Self Care | Payer: 59 | Attending: Emergency Medicine | Admitting: Emergency Medicine

## 2017-02-17 ENCOUNTER — Emergency Department: Payer: 59

## 2017-02-17 ENCOUNTER — Other Ambulatory Visit: Payer: Self-pay

## 2017-02-17 DIAGNOSIS — R51 Headache: Secondary | ICD-10-CM | POA: Diagnosis not present

## 2017-02-17 DIAGNOSIS — R079 Chest pain, unspecified: Secondary | ICD-10-CM | POA: Insufficient documentation

## 2017-02-17 DIAGNOSIS — H5710 Ocular pain, unspecified eye: Secondary | ICD-10-CM | POA: Diagnosis present

## 2017-02-17 DIAGNOSIS — R519 Headache, unspecified: Secondary | ICD-10-CM

## 2017-02-17 DIAGNOSIS — Z79899 Other long term (current) drug therapy: Secondary | ICD-10-CM | POA: Insufficient documentation

## 2017-02-17 LAB — CBC
HEMATOCRIT: 42.2 % (ref 35.0–47.0)
HEMOGLOBIN: 14.6 g/dL (ref 12.0–16.0)
MCH: 31.8 pg (ref 26.0–34.0)
MCHC: 34.6 g/dL (ref 32.0–36.0)
MCV: 92.1 fL (ref 80.0–100.0)
PLATELETS: 299 10*3/uL (ref 150–440)
RBC: 4.59 MIL/uL (ref 3.80–5.20)
RDW: 13.2 % (ref 11.5–14.5)
WBC: 8.4 10*3/uL (ref 3.6–11.0)

## 2017-02-17 LAB — BASIC METABOLIC PANEL
ANION GAP: 6 (ref 5–15)
BUN: 17 mg/dL (ref 6–20)
CHLORIDE: 102 mmol/L (ref 101–111)
CO2: 29 mmol/L (ref 22–32)
Calcium: 9.1 mg/dL (ref 8.9–10.3)
Creatinine, Ser: 0.58 mg/dL (ref 0.44–1.00)
GFR calc non Af Amer: >60 mL/min (ref >60)
Glucose, Bld: 106 mg/dL — ABNORMAL HIGH (ref 65–99)
POTASSIUM: 3.7 mmol/L (ref 3.5–5.1)
SODIUM: 137 mmol/L (ref 135–145)

## 2017-02-17 LAB — TROPONIN I: Troponin I: <0.03 ng/mL (ref <0.03)

## 2017-02-17 MED ORDER — KETOROLAC TROMETHAMINE 30 MG/ML IJ SOLN: 30.0000 mg | Freq: Once | INTRAMUSCULAR | Status: DC

## 2017-02-17 MED ORDER — BUTALBITAL-APAP-CAFFEINE 50-325-40 MG PO TABS: 1.0000 | ORAL_TABLET | Freq: Four times a day (QID) | ORAL | 0 refills | Status: AC | PRN

## 2017-02-17 MED ORDER — DIPHENHYDRAMINE HCL 50 MG/ML IJ SOLN
25.0000 mg | Freq: Once | INTRAMUSCULAR | Status: AC
  Administered 2017-02-17: 25 mg via INTRAVENOUS
  Filled 2017-02-17: qty 1

## 2017-02-17 MED ORDER — SODIUM CHLORIDE 0.9 % IV BOLUS (SEPSIS)
1000.0000 mL | Freq: Once | INTRAVENOUS | Status: AC
  Administered 2017-02-17: 1000 mL via INTRAVENOUS

## 2017-02-17 MED ORDER — BUTALBITAL-APAP-CAFFEINE 50-325-40 MG PO TABS: 1.0000 | ORAL_TABLET | Freq: Four times a day (QID) | ORAL | Status: DC | PRN

## 2017-02-17 MED ORDER — PROCHLORPERAZINE EDISYLATE 5 MG/ML IJ SOLN
10.0000 mg | Freq: Once | INTRAMUSCULAR | Status: AC
  Administered 2017-02-17: 10 mg via INTRAVENOUS
  Filled 2017-02-17: qty 2

## 2017-02-17 NOTE — ED Notes (Signed)
Pt presents with left-sided face, neck, jaw pain. She states that the other side is perfectly fine. She states she has taken OTC meds as well as gabapentin with no relief. Pt states pain is pressure-like with stabbing. She also reports mid chest pressure. Denies sob except "when the pain gets very bad in my head." Pt alert & oriented with NAD noted.

## 2017-02-17 NOTE — ED Provider Notes (Signed)
[facility] Emergency Department Provider Note  ____________________________________________   First MD Initiated Contact with Patient 02/17/17 1606     (approximate)  I have reviewed the triage vital signs and the nursing notes.   HISTORY  Chief Complaint Chest Pain   HPI Donna Bradford is a 36 y.o. female with a history of PCO S who is presenting to the emergency department with left periorbital pain extending to the back of her head as well as her teeth and now down to her chest.  Says the pain started yesterday and is now on a half out of 10.  Says the pain is throbbing like a pulsation.  Despite the triage note the patient is denying any blurred vision.  Denies sensitive to light.  Reports mild nausea when the pain increases.  No headache history.  Says the pain is steadily increased since yesterday.  Past Medical History:  Diagnosis Date  . Back pain   . History of chicken pox   . Hypertriglyceridemia   . Polycystic ovarian disease   . Vaginal delivery    x1    Patient Active Problem List   Diagnosis Date Noted  . Routine general medical examination at a health care facility 02/02/2014  . Hypertriglyceridemia 09/22/2011    Past Surgical History:  Procedure Laterality Date  . ABDOMINAL HYSTERECTOMY    . CESAREAN SECTION     x1  . DILATION AND CURETTAGE OF UTERUS  06/28/12  . NO PAST SURGERIES    . OVARIAN CYST REMOVAL  2008    Prior to Admission medications   Medication Sig Start Date End Date Taking? Authorizing Provider  Multiple Vitamins-Minerals (MULTIVITAMIN PO) Take by mouth.    [provider]    Allergies Patient has no known allergies.  Family History  Problem Relation Age of Onset  . Hypertension Paternal Grandfather   . Kidney disease Paternal Grandfather   . Hypertension Mother   . Diabetes Mother   . Hypertension Father   . Diabetes Maternal Grandmother   . Hypertension Maternal Grandmother   .  Diabetes Maternal Grandfather   . Thyroid disease Paternal Grandmother   . Polycystic ovary syndrome Sister   . Muscular dystrophy Son     Social History Social History   Tobacco Use  . Smoking status: Never Smoker  . Smokeless tobacco: Never Used  Substance Use Topics  . Alcohol use: No  . Drug use: No    Review of Systems  Constitutional: No fever/chills Eyes: No visual changes. ENT: No sore throat. Cardiovascular: Denies chest pain. Respiratory: Denies shortness of breath. Gastrointestinal: No abdominal pain.  no vomiting.  No diarrhea.  No constipation. Genitourinary: Negative for dysuria. Musculoskeletal: Negative for back pain. Skin: Negative for rash. Neurological: Negative for focal weakness or numbness.   ____________________________________________   PHYSICAL EXAM:  VITAL SIGNS: ED Triage Vitals  Enc Vitals Group     BP 02/17/17 1452 (!) 150/90     Pulse Rate 02/17/17 1452 66     Resp 02/17/17 1452 16     Temp 02/17/17 1452 97.9 F (36.6 C)     Temp Source 02/17/17 1452 Oral     SpO2 02/17/17 1452 99 %     Weight 02/17/17 1453 192 lb (87.1 kg)     Height 02/17/17 1453 5\' 4"  (1.626 m)     Head Circumference --      Peak Flow --      Pain Score 02/17/17 1452 8  Pain Loc --      Pain Edu? --      Excl. in GC? --     Constitutional: Alert and oriented. Well appearing and in no acute distress. Eyes: Conjunctivae are normal.  PERRLA.  EOMI.   Head: Atraumatic.  No tenderness to palpation or nodularity along the distribution of the temporal arteries. Nose: No congestion/rhinnorhea. Mouth/Throat: Mucous membranes are moist.  No tenderness to the teeth.  No gingival or mucosal swelling.  No pus visualized. Neck: No stridor.   Cardiovascular: Normal rate, regular rhythm. Grossly normal heart sounds.   Respiratory: Normal respiratory effort.  No retractions. Lungs CTAB. Gastrointestinal: Soft and nontender. No distention. No CVA  tenderness. Musculoskeletal: No lower extremity tenderness nor edema.  No joint effusions. Neurologic:  Normal speech and language. No gross focal neurologic deficits are appreciated. Skin:  Skin is warm, dry and intact. No rash noted. Psychiatric: Mood and affect are normal. Speech and behavior are normal.  ____________________________________________   LABS (all labs ordered are listed, but only abnormal results are displayed)  Labs Reviewed  BASIC METABOLIC PANEL - Abnormal; Notable for the following components:      Result Value   Glucose, Bld 106 (*)    All other components within normal limits  CBC  TROPONIN I  POC URINE PREG, ED   ____________________________________________  EKG  ED ECG REPORT I, Arelia Longestavid M Zakyria Metzinger, the attending physician, personally viewed and interpreted this ECG.   Date: 02/17/2017  EKG Time: 1452  Rate: 70  Rhythm: normal sinus rhythm  Axis: Normal  Intervals:none  ST&T Change: No ST segment elevation or depression.  No abnormal T wave inversion.  ____________________________________________  RADIOLOGY  No acute finding on the chest x-ray.   ____________________________________________   PROCEDURES  Procedure(s) performed:   Procedures  Critical Care performed:   ____________________________________________   INITIAL IMPRESSION / ASSESSMENT AND PLAN / ED COURSE  Pertinent labs & imaging results that were available during my care of the patient were reviewed by me and considered in my medical decision making (see chart for details).  Differential diagnosis includes, but is not limited to, ACS, aortic dissection, pulmonary embolism, cardiac tamponade, pneumothorax, pneumonia, pericarditis, myocarditis, GI-related causes including esophagitis/gastritis, and musculoskeletal chest wall pain.   Differential diagnosis includes, but is not limited to, intracranial hemorrhage, meningitis/encephalitis, previous head trauma, cavernous  venous thrombosis, tension headache, temporal arteritis, migraine or migraine equivalent, idiopathic intracranial hypertension, and non-specific headache. As part of my medical decision making, I reviewed the following data within the electronic MEDICAL RECORD NUMBER Old chart reviewed     ----------------------------------------- 6:27 PM on 02/17/2017 -----------------------------------------  Patient is pain-free at this time.  She is denying any complaints.  Likely migraine type headache.  We discussed imaging and the patient would rather forego this currently.  My initial plan was to do a CT angiography of the patient was continually symptomatic.  However, with relief of the symptoms completely I think it is appropriate that the patient have this done in the outpatient setting.  We discussed this as well as receiving a prescription for Fioricet.  The patient is understanding of this plan willing to comply.  ____________________________________________   FINAL CLINICAL IMPRESSION(S) / ED DIAGNOSES  Headache    NEW MEDICATIONS STARTED DURING THIS VISIT:  This SmartLink is deprecated. Use AVSMEDLIST instead to display the medication list for a patient.   Note:  This document was prepared using Dragon voice recognition software and may include unintentional dictation errors.  Myrna BlazerSchaevitz, Sandie Swayze Matthew, MD 02/17/17 [phone]

## 2017-02-17 NOTE — ED Triage Notes (Addendum)
Pt to ED reporting left sided facial pain yesterday that progressed into left sided jaw and neck pain that is now centralized chest pain. Pt also reports having intermittent blurred vision and "pressure: in left eye. No painful teeth reported. No fevers reported. No SOB. Pt is reporting intermittent dizzy spells. No syncopal episodes reported.   No neuro deficits noted at this time.

## 2017-10-31 IMAGING — US US ABDOMEN COMPLETE
1 series · 13 of 25 positions shown · non-contrast
Comparison: None in PACs

CLINICAL DATA: Elevated liver function studies

EXAM:
ABDOMEN ULTRASOUND COMPLETE

[Series 1: us abdomen complete · 0.19mm/px · 13 of 90 slices shown]
[im 1/90]
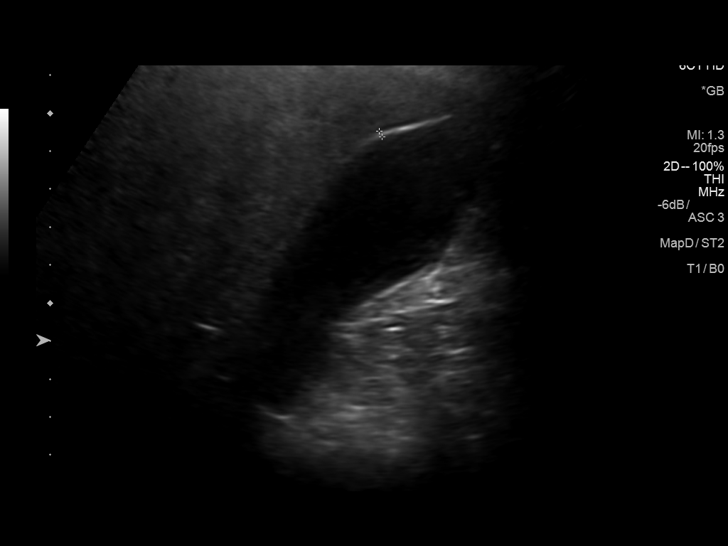
[im 8/90]
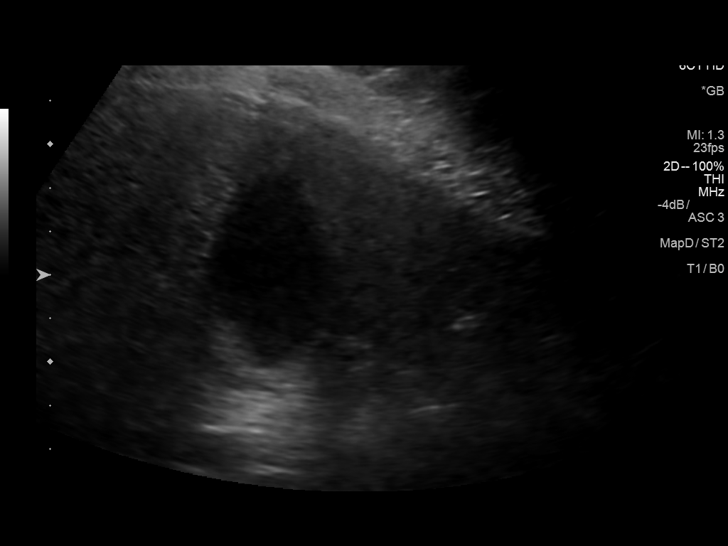
[im 15/90]
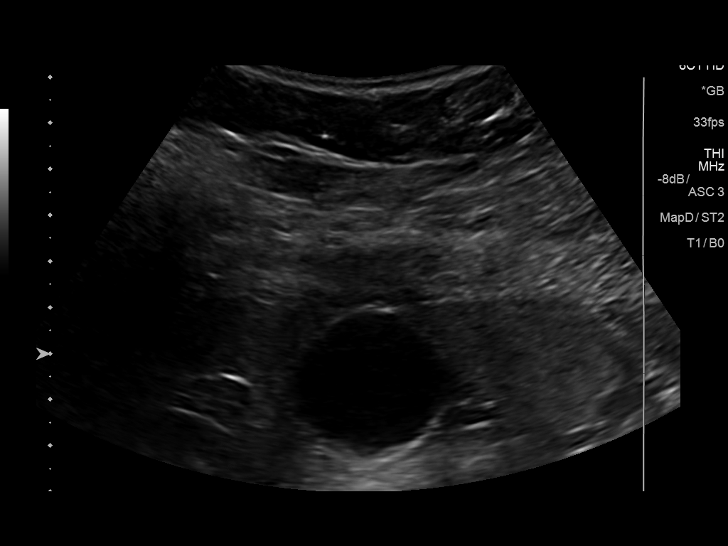
[im 23/90]
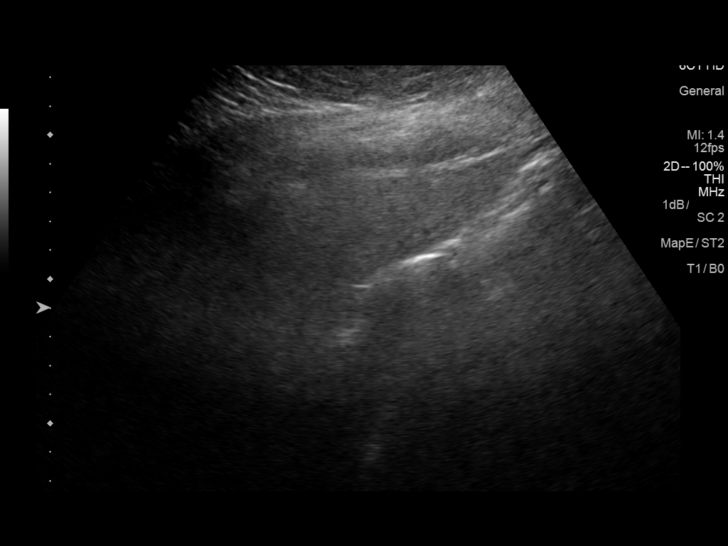
[im 30/90]
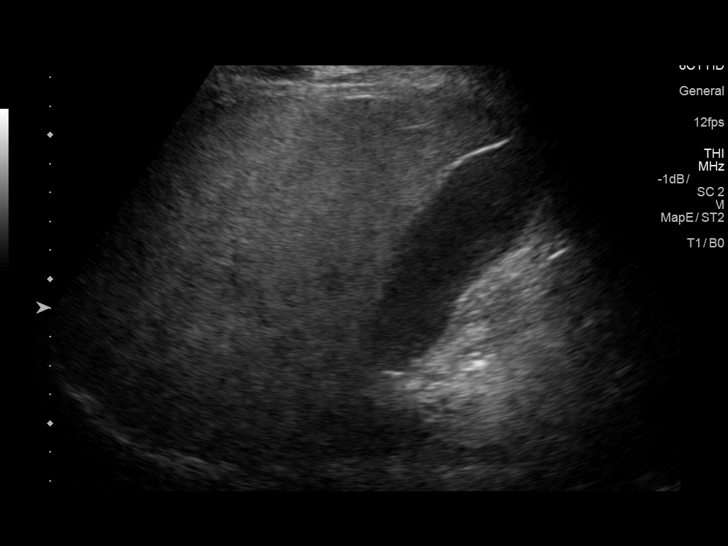
[im 38/90]
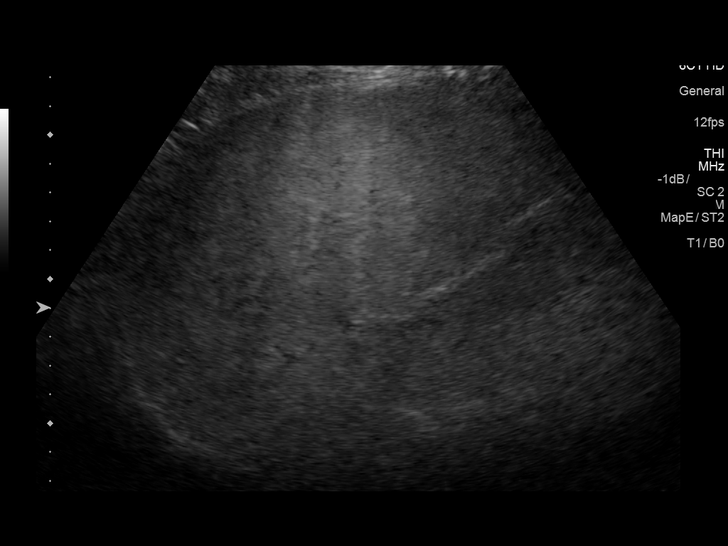
[im 45/90]
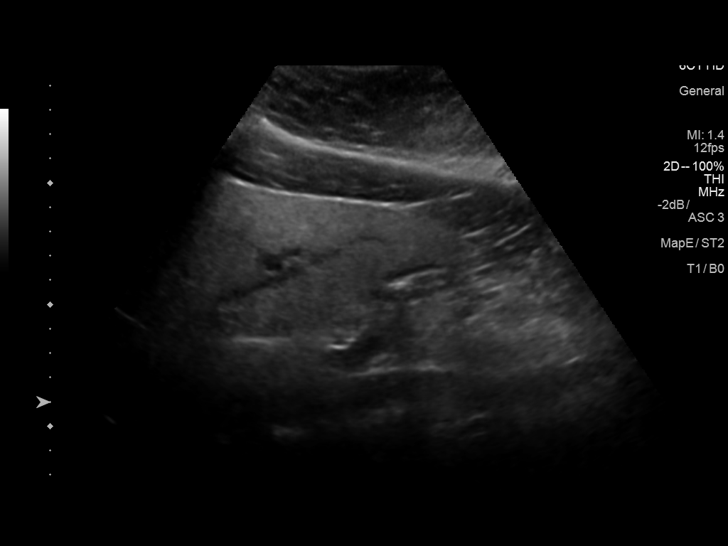
[im 52/90]
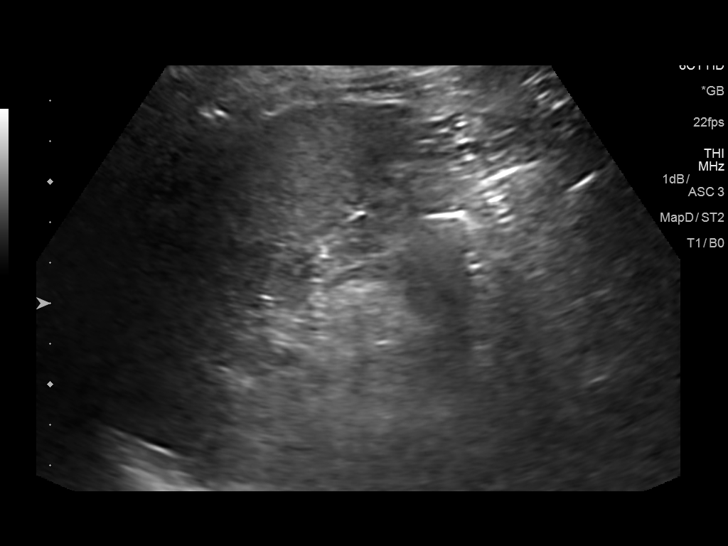
[im 60/90]
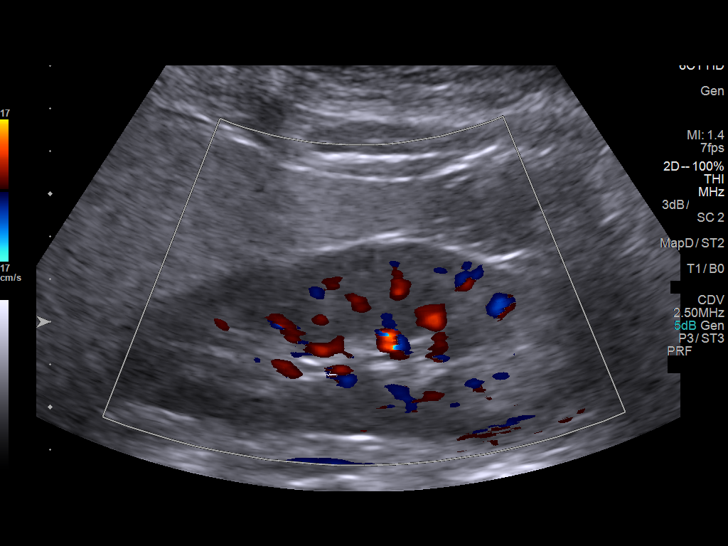
[im 67/90]
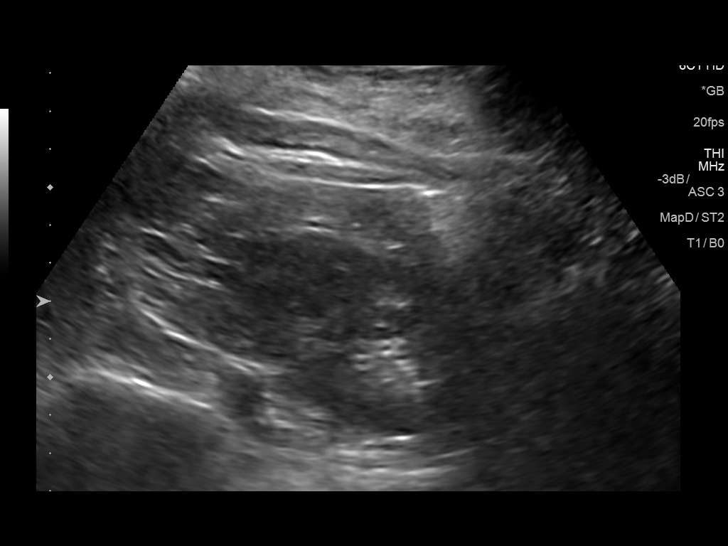
[im 75/90]
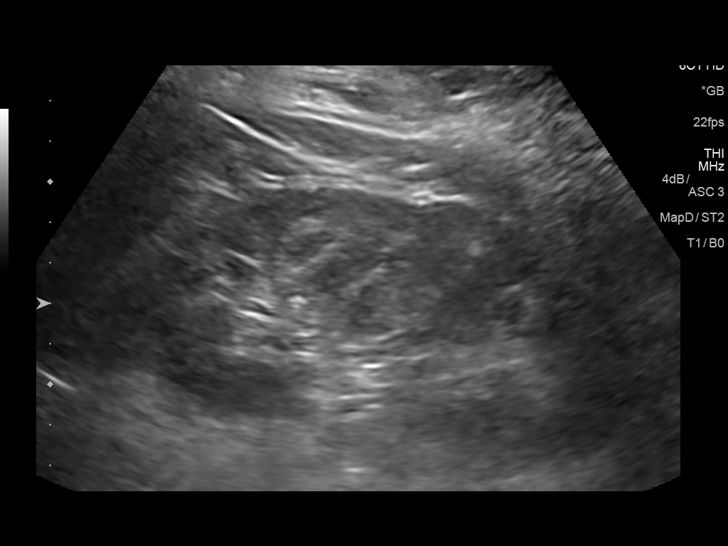
[im 82/90]
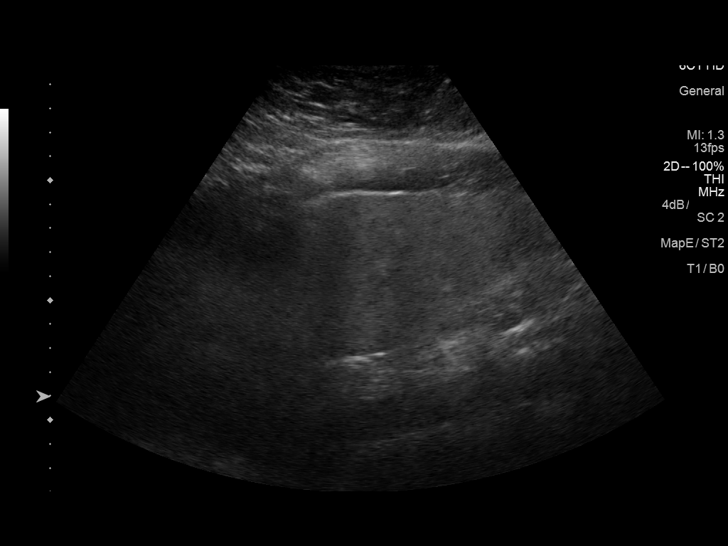
[im 90/90]
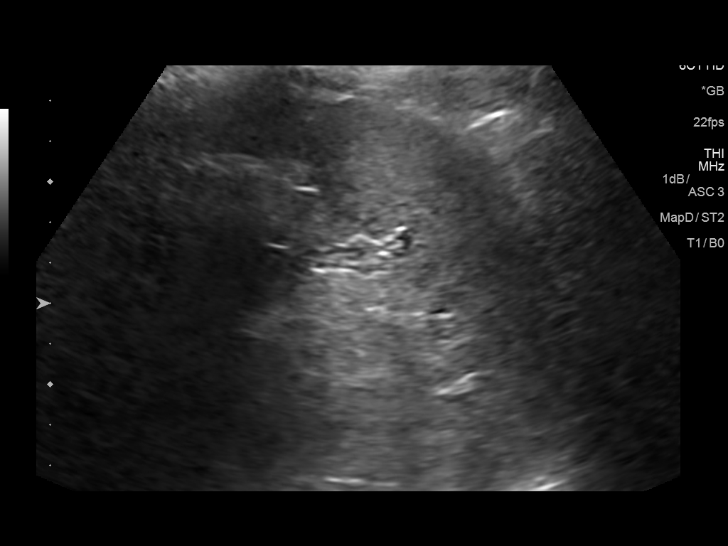

[13 of 25 positions shown; findings below may reference images not displayed]

FINDINGS: Gallbladder: No gallstones or wall thickening visualized. No
sonographic Murphy sign noted by sonographer.

Common bile duct: Diameter: 4.2 mm

Liver: The hepatic echotexture is mildly increased diffusely. There
is no focal mass nor ductal dilation.

IVC: No abnormality visualized.

Pancreas: Visualized portion unremarkable.

Spleen: Size and appearance within normal limits.

Right Kidney: Length: 12.3 cm. Echogenicity within normal limits. No
mass or hydronephrosis visualized.

Left Kidney: Length: 13.2 cm. Echogenicity within normal limits. No
mass or hydronephrosis visualized.

Abdominal aorta: No aneurysm visualized.

Other findings: There is no ascites.
IMPRESSION: No gallstones or sonographic evidence of acute cholecystitis. If
there are clinical concerns of chronic gallbladder dysfunction, a
nuclear medicine hepatobiliary scan with gallbladder ejection
fraction determination may be useful.

Increased hepatic echotexture likely reflects fatty infiltrative
change. No focal mass or ductal dilation or surface contour
abnormality is observed. If the patient's elevated liver function
studies remain unexplained, hepatic protocol MRI may be the most
useful next imaging step.

No acute abnormality observed elsewhere within the abdomen.

## 2019-04-25 IMAGING — CR DG CHEST 2V
1 series · 2 of 2 positions shown · non-contrast
Comparison: None.

CLINICAL DATA: Chest and jaw pain.

EXAM:
CHEST  2 VIEW

[Series 1: dg chest 2 view · 0.14mm/px · 2 of 2 slices shown]
[im 1/2]
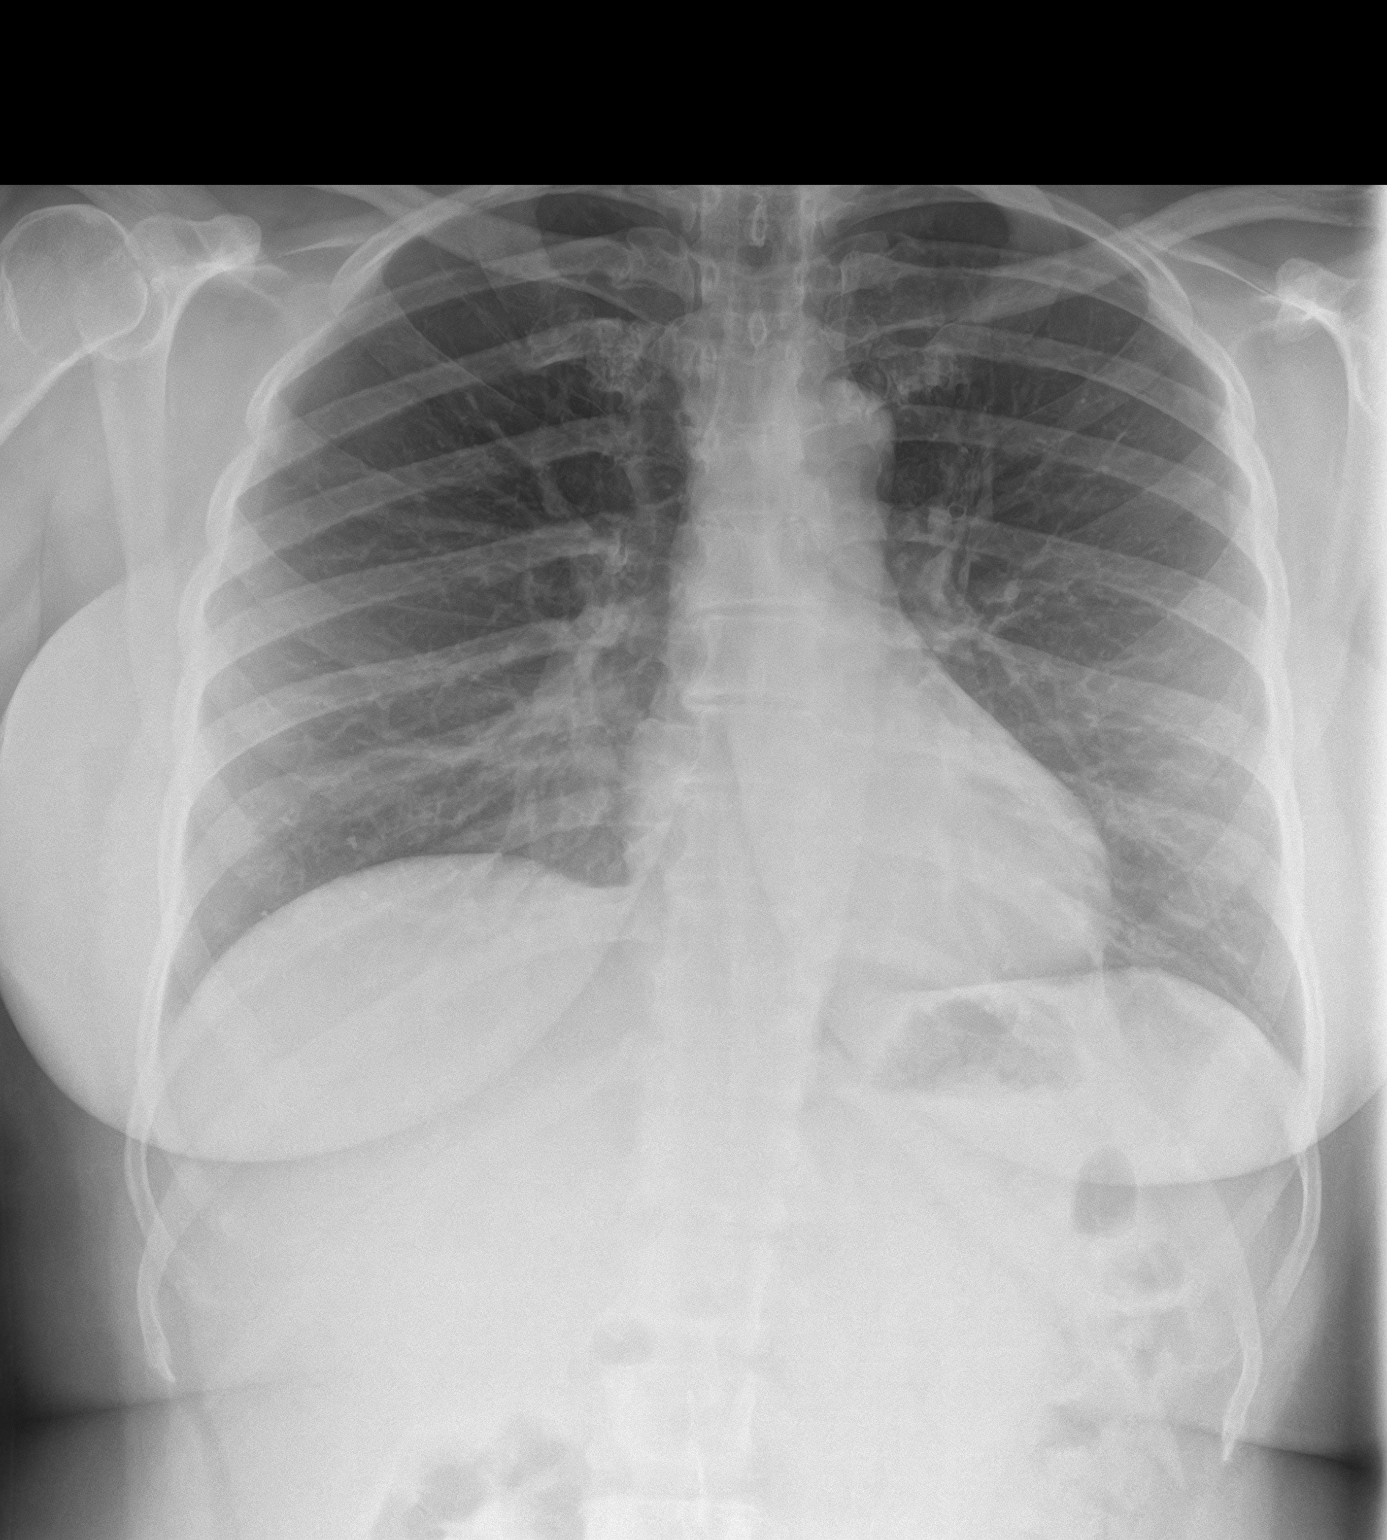
[im 2/2]
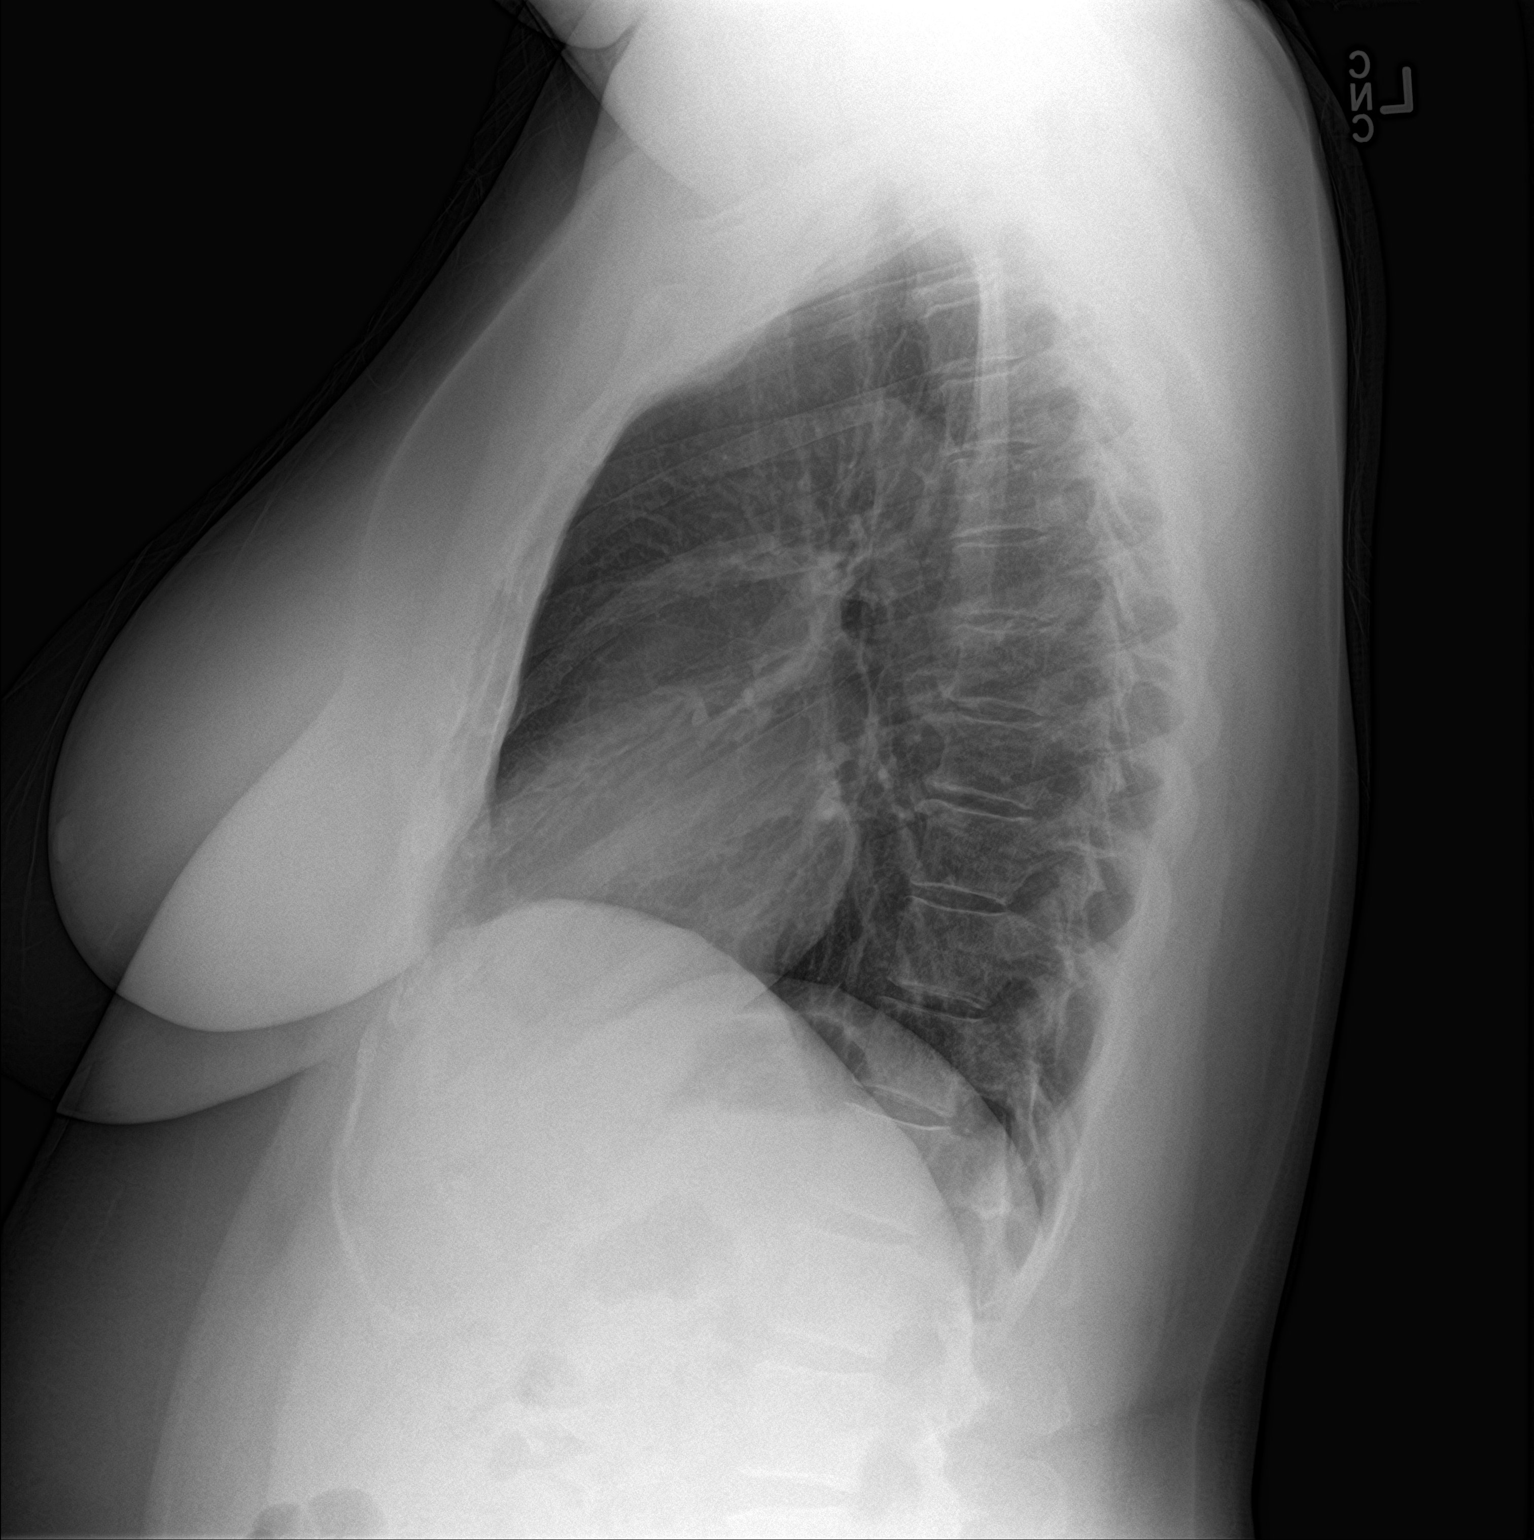

[2 of 2 positions shown; findings below may reference images not displayed]

FINDINGS: The heart size and mediastinal contours are within normal limits.
Both lungs are clear. The visualized skeletal structures are
unremarkable.
IMPRESSION: No active cardiopulmonary disease.
